# Patient Record
Sex: Male | Born: 1980 | Race: White | Hispanic: No | Marital: Married | Smoking: Never smoker
Health system: Southern US, Community
[De-identification: ages and names within clinical notes are randomized; demographics above are authoritative.]

---
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
## 2002-08-09 ENCOUNTER — Emergency Department (HOSPITAL_COMMUNITY): Admission: AC | Admit: 2002-08-09 | Discharge: 2002-08-09 | Payer: Self-pay

## 2007-05-09 ENCOUNTER — Encounter: Admission: RE | Admit: 2007-05-09 | Discharge: 2007-05-09 | Payer: Self-pay | Admitting: Internal Medicine

## 2012-07-22 ENCOUNTER — Other Ambulatory Visit: Payer: Self-pay

## 2012-07-22 ENCOUNTER — Emergency Department (HOSPITAL_COMMUNITY)
Admission: EM | Admit: 2012-07-22 | Discharge: 2012-07-22 | Disposition: A | Discharge status: Home / Self Care | Payer: BC Managed Care – PPO | Attending: Emergency Medicine | Admitting: Emergency Medicine

## 2012-07-22 ENCOUNTER — Emergency Department (HOSPITAL_COMMUNITY): Payer: BC Managed Care – PPO

## 2012-07-22 ENCOUNTER — Encounter (HOSPITAL_COMMUNITY): Payer: Self-pay | Admitting: *Deleted

## 2012-07-22 DIAGNOSIS — R079 Chest pain, unspecified: Secondary | ICD-10-CM

## 2012-07-22 DIAGNOSIS — R0789 Other chest pain: Secondary | ICD-10-CM | POA: Insufficient documentation

## 2012-07-22 LAB — BASIC METABOLIC PANEL
Calcium: 9.4 mg/dL (ref 8.4–10.5)
GFR calc Af Amer: >90 mL/min (ref >90)
GFR calc non Af Amer: >90 mL/min (ref >90)
Potassium: 3.7 mEq/L (ref 3.5–5.1)
Sodium: 139 mEq/L (ref 135–145)

## 2012-07-22 LAB — CBC
MCH: 30.9 pg (ref 26.0–34.0)
MCHC: 36.4 g/dL — ABNORMAL HIGH (ref 30.0–36.0)
Platelets: 246 10*3/uL (ref 150–400)
RDW: 12.8 % (ref 11.5–15.5)

## 2012-07-22 LAB — PRO B NATRIURETIC PEPTIDE: Pro B Natriuretic peptide (BNP): 29.6 pg/mL (ref 0–125)

## 2012-07-22 LAB — POCT I-STAT TROPONIN I: Troponin i, poc: 0 ng/mL (ref 0.00–0.08)

## 2012-07-22 NOTE — ED Notes (Signed)
Pt reports running short distance, began having discomfort in his neck, then onset of sharp pains in mid chest, sharp pains radiating into his shoulder. Reports mild sob. No acute distress noted at this time, ekg being done at triage.

## 2012-07-22 NOTE — ED Provider Notes (Signed)
History     CSN: [id number]  Arrival date & time 07/22/12  1520   First MD Initiated Contact with Patient 07/22/12 1604      Chief Complaint  Patient presents with  . Chest Pain    (Consider location/radiation/quality/duration/timing/severity/associated sxs/prior treatment) HPI..... Fleeting central anterior chest pain  with radiation to the left chest approximately one hour prior to admission. Symptoms lasted less than 60 seconds. No dyspnea, diaphoresis, nausea. Nonsmoker. No family history of cardiac disease. Patient has no risk factors for heart disease.   He is completely symptom free at this time   History reviewed. No pertinent past medical history.  History reviewed. No pertinent past surgical history.  History reviewed. No pertinent family history.  History  Substance Use Topics  . Smoking status: Not on file  . Smokeless tobacco: Current User    Types: Chew  . Alcohol Use: No      Review of Systems  All other systems reviewed and are negative.    Allergies  Review of patient's allergies indicates no known allergies.  Home Medications   Current Outpatient Rx  Name  Route  Sig  Dispense  Refill  . acetaminophen (TYLENOL) 80 MG chewable tablet   Oral   Chew 80 mg by mouth every 4 (four) hours as needed for pain.         [facility] Kitchen PRESCRIPTION MEDICATION   Oral   Take 1 tablet by mouth once. Nitroglycerin           BP 106/72  Pulse 77  Resp 18  SpO2 97%  Physical Exam  Nursing note and vitals reviewed. Constitutional: He is oriented to person, place, and time. He appears well-developed and well-nourished.  HENT:  Head: Normocephalic and atraumatic.  Eyes: Conjunctivae and EOM are normal. Pupils are equal, round, and reactive to light.  Neck: Normal range of motion. Neck supple.  Cardiovascular: Normal rate, regular rhythm and normal heart sounds.   Pulmonary/Chest: Effort normal and breath sounds normal.  Abdominal: Soft. Bowel sounds are normal.   Musculoskeletal: Normal range of motion.  Neurological: He is alert and oriented to person, place, and time.  Skin: Skin is warm and dry.  Psychiatric: He has a normal mood and affect.    ED Course  Procedures (including critical care time)  Labs Reviewed  CBC - Abnormal; Notable for the following:    MCHC 36.4 (*)    All other components within normal limits  BASIC METABOLIC PANEL - Abnormal; Notable for the following:    Glucose, Bld 136 (*)    All other components within normal limits  PRO B NATRIURETIC PEPTIDE  POCT I-STAT TROPONIN I   Dg Chest 2 View  07/22/2012   *RADIOLOGY REPORT*  Clinical Data: Chest pain.  CHEST - 2 VIEW  Comparison: None.  Findings: Midline trachea.  Normal heart size and mediastinal contours. No pleural effusion or pneumothorax.  Clear lungs.  IMPRESSION: Normal chest.   Original Report Authenticated By: Jeronimo Greaves, M.D.    Date: 07/22/2012  Rate: 85  Rhythm: normal sinus rhythm  QRS Axis: right  Intervals: normal  ST/T Wave abnormalities: normal  Conduction Disutrbances: none  Narrative Interpretation: unremarkable     1. Chest pain       MDM  Patient is very low risk for acute coronary syndrome or pulmonary embolism. Screening tests are normal. Patient will get primary care followup        Donnetta Hutching, MD 07/22/12 1800

## 2012-07-22 NOTE — ED Notes (Signed)
PT took 2 ,81mg  ASA today and onr NTG SL tab. For CP at home

## 2012-07-22 NOTE — ED Notes (Signed)
PT to xray

## 2012-09-11 ENCOUNTER — Encounter (HOSPITAL_COMMUNITY): Payer: Self-pay | Admitting: *Deleted

## 2012-09-11 ENCOUNTER — Emergency Department (HOSPITAL_COMMUNITY)
Admission: EM | Admit: 2012-09-11 | Discharge: 2012-09-12 | Disposition: A | Discharge status: Home / Self Care | Payer: BC Managed Care – PPO | Attending: Emergency Medicine | Admitting: Emergency Medicine

## 2012-09-11 DIAGNOSIS — W268XXA Contact with other sharp object(s), not elsewhere classified, initial encounter: Secondary | ICD-10-CM | POA: Insufficient documentation

## 2012-09-11 DIAGNOSIS — S61209A Unspecified open wound of unspecified finger without damage to nail, initial encounter: Secondary | ICD-10-CM | POA: Insufficient documentation

## 2012-09-11 DIAGNOSIS — Y93G9 Activity, other involving cooking and grilling: Secondary | ICD-10-CM | POA: Insufficient documentation

## 2012-09-11 DIAGNOSIS — Y929 Unspecified place or not applicable: Secondary | ICD-10-CM | POA: Insufficient documentation

## 2012-09-11 DIAGNOSIS — S61001A Unspecified open wound of right thumb without damage to nail, initial encounter: Secondary | ICD-10-CM

## 2012-09-11 MED ORDER — OXYCODONE-ACETAMINOPHEN 5-325 MG PO TABS
1.0000 | ORAL_TABLET | Freq: Once | ORAL | Status: AC
  Administered 2012-09-11: 1 via ORAL
  Filled 2012-09-11: qty 1

## 2012-09-11 MED ORDER — HYDROCODONE-ACETAMINOPHEN 5-325 MG PO TABS: 1.0000 | ORAL_TABLET | Freq: Four times a day (QID) | ORAL | Status: DC | PRN

## 2012-09-11 NOTE — ED Notes (Signed)
Pt reports he cut his right thumb using a slicer. Bleeding controlled at present. Pt came by EMS. Report he passed out after seeing the blood from his thumb.

## 2012-09-11 NOTE — ED Provider Notes (Signed)
History  This chart was scribed for non-physician practitioner working with Geoffery Lyons, MD by Greggory Stallion, ED scribe. This patient was seen in room WTR9/WTR9 and the patient's care was started at 11:35 PM.  CSN: [id number] Arrival date & time 09/11/12  2142  None    Chief Complaint  Patient presents with  . Laceration   The history is provided by the patient. No language interpreter was used.    HPI Comments: Aaron Freeman is a 32 y.o. male brought to ED by EMS who presents to the Emergency Department complaining sudden onset pain to right thumb. Pt states he sliced his hand with a kitchen mandolin around 6:30 PM tonight. Pain is sharp, localized throbbing when touched. Moderate when left alone. He states he can move it normally. Pt denies numbness. Pt states is UTD with tetanus.   History reviewed. No pertinent past medical history. History reviewed. No pertinent past surgical history. History reviewed. No pertinent family history. History  Substance Use Topics  . Smoking status: Not on file  . Smokeless tobacco: Current User    Types: Chew  . Alcohol Use: No    Review of Systems  Constitutional: Negative for fever and diaphoresis.  HENT: Negative for neck pain and neck stiffness.   Eyes: Negative for visual disturbance.  Respiratory: Negative for apnea, chest tightness and shortness of breath.   Cardiovascular: Negative for chest pain and palpitations.  Gastrointestinal: Negative for nausea, vomiting, diarrhea and constipation.  Genitourinary: Negative for dysuria.  Musculoskeletal: Negative for gait problem.  Skin: Positive for wound. Negative for rash.  Neurological: Negative for dizziness, weakness, light-headedness, numbness and headaches.    Allergies  Review of patient's allergies indicates no known allergies.  Home Medications   Current Outpatient Rx  Name  Route  Sig  Dispense  Refill  . ibuprofen (ADVIL,MOTRIN) 200 MG tablet   Oral   Take 400 mg by  mouth every 6 (six) hours as needed for pain (pain).          BP 129/85  Pulse 79  Temp(Src) 98.7 F (37.1 C) (Oral)  Resp 18  SpO2 98%  Physical Exam  Nursing note and vitals reviewed. Constitutional: He is oriented to person, place, and time. He appears well-developed and well-nourished. No distress.  HENT:  Head: Normocephalic and atraumatic.  Eyes: Conjunctivae and EOM are normal.  Neck: Normal range of motion. Neck supple.  No meningeal signs  Cardiovascular: Normal rate, regular rhythm and normal heart sounds.  Exam reveals no gallop and no friction rub.   No murmur heard. Pulmonary/Chest: Effort normal and breath sounds normal. No respiratory distress. He has no wheezes. He has no rales. He exhibits no tenderness.  Abdominal: Soft. Bowel sounds are normal. He exhibits no distension. There is no tenderness. There is no rebound and no guarding.  Musculoskeletal: Normal range of motion. He exhibits no edema and no tenderness.  Neurological: He is alert and oriented to person, place, and time. No cranial nerve deficit.  Neurovascularly intact. Sensation to light touch and two point discrimination intact.   Skin: Skin is warm and dry. He is not diaphoretic. No erythema.  3 cm long, 2 cm wide laceration to lateral distal aspect of left thumb. Deep to dermis. No tendon involvement. No nail involvement. No visible bone.     ED Course  Procedures (including critical care time)  DIAGNOSTIC STUDIES: Oxygen Saturation is 98% on RA, normal by my interpretation.    COORDINATION OF CARE: 11:39  PM-Discussed treatment plan which includes pain medication and cleaning laceration and dressing it with pt at bedside and pt agreed to plan. Advised pt to follow up with a PCP for wound check.  Labs Reviewed - No data to display No results found. 1. Avulsion of skin of thumb without complication, right, initial encounter     MDM  Wound cleaning complete with pressure irrigation, bottom of  wound visualized, no foreign bodies appreciated. Laceration occurred < 8 hours prior. Skin avulsion to lateral aspect of right thumb deep to dermis, no bone visualized, no nail or tendon involvement, not amendable to laceration repair. Will treat with bacitracin, xeroform, and dry sterile dressing. Discussed wound care with pt, reasons to seek medical care during the healing process, and pain control. Discussed reasons to seek immediate care. Patient expresses understanding and agrees with plan.  I personally performed the services described in this documentation, which was scribed in my presence. The recorded information has been reviewed and is accurate.    Glade Nurse, PA-C 09/12/12 [phone]

## 2012-09-12 NOTE — ED Provider Notes (Signed)
Medical screening examination/treatment/procedure(s) were performed by non-physician practitioner and as supervising physician I was immediately available for consultation/collaboration.  Geoffery Lyons, MD 09/12/12 [phone]

## 2012-09-12 NOTE — ED Notes (Signed)
Pt reports cooking dinner tonight using a mandoline slicer-was not using the safety.  Avulsion noted on pt's R thumb.

## 2013-03-25 ENCOUNTER — Ambulatory Visit (INDEPENDENT_AMBULATORY_CARE_PROVIDER_SITE_OTHER): Payer: BC Managed Care – PPO | Admitting: Emergency Medicine

## 2013-03-25 VITALS — BP 110/80 | HR 77 | Temp 98.0°F | Resp 16 | Ht 73.0 in | Wt 280.0 lb

## 2013-03-25 DIAGNOSIS — M109 Gout, unspecified: Secondary | ICD-10-CM

## 2013-03-25 MED ORDER — COLCHICINE 0.6 MG PO TABS: ORAL_TABLET | ORAL | Status: DC

## 2013-03-25 MED ORDER — INDOMETHACIN 50 MG PO CAPS: 50.0000 mg | ORAL_CAPSULE | Freq: Three times a day (TID) | ORAL | Status: DC

## 2013-03-25 NOTE — Progress Notes (Signed)
[facility] [address], [place] [address] [phone]  Date:  03/25/2013   Name:  Aaron AdeRichard D Freeman   DOB:  1980/09/08   MRN:  [id number]  PCP:  No PCP Per Patient    Chief Complaint: Gout   History of Present Illness:  Aaron Freeman is a 33 y.o. very pleasant male patient who presents with the following:  History of gout.  Says he has an exacerbation every 3-4 months.  Now has pain in right knee that has been present 2 days.  4-5th flare.   Out of medication.  Last seen here in 2012.  No history of injury or overuse.  No fever or chills.  No improvement with over the counter medications or other home remedies. Denies other complaint or health concern today.  There are no active problems to display for this patient.   History reviewed. No pertinent past medical history.  History reviewed. No pertinent past surgical history.  History  Substance Use Topics  . Smoking status: Never Smoker   . Smokeless tobacco: Current User    Types: Chew  . Alcohol Use: No    History reviewed. No pertinent family history.  No Known Allergies  Medication list has been reviewed and updated.  Current Outpatient Prescriptions on File Prior to Visit  Medication Sig Dispense Refill  . HYDROcodone-acetaminophen (NORCO/VICODIN) 5-325 MG per tablet Take 1 tablet by mouth every 6 (six) hours as needed for pain.  10 tablet  0  . ibuprofen (ADVIL,MOTRIN) 200 MG tablet Take 400 mg by mouth every 6 (six) hours as needed for pain (pain).       No current facility-administered medications on file prior to visit.    Review of Systems:  As per HPI, otherwise negative.    Physical Examination: Filed Vitals:   03/25/13 1452  BP: 110/80  Pulse: 77  Temp: 98 F (36.7 C)  Resp: 16   Filed Vitals:   03/25/13 1452  Height: 6\' 1"  (1.854 m)  Weight: 280 lb (127.007 kg)   Body mass index is 36.95 kg/(m^2). Ideal Body Weight: Weight in (lb) to have BMI = 25:  189.1   GEN: WDWN, NAD, Non-toxic, Alert & Oriented x 3 HEENT: Atraumatic, Normocephalic.  Ears and Nose: No external deformity. EXTR: No clubbing/cyanosis/edema NEURO: Normal gait.  PSYCH: Normally interactive. Conversant. Not depressed or anxious appearing.  Calm demeanor.  RIGHT KNEE:  Swollen moderate effusion.  Warm.  No cellulitis   Assessment and Plan: Gout colcrys Indocin  Signed,  Phillips OdorJeffery Anderson, MD

## 2013-03-25 NOTE — Patient Instructions (Signed)

## 2014-01-09 ENCOUNTER — Ambulatory Visit (INDEPENDENT_AMBULATORY_CARE_PROVIDER_SITE_OTHER): Payer: BC Managed Care – PPO | Admitting: Podiatry

## 2014-01-09 ENCOUNTER — Ambulatory Visit (INDEPENDENT_AMBULATORY_CARE_PROVIDER_SITE_OTHER): Payer: BC Managed Care – PPO

## 2014-01-09 ENCOUNTER — Encounter: Payer: Self-pay | Admitting: Podiatry

## 2014-01-09 VITALS — BP 137/87 | HR 88 | Resp 16

## 2014-01-09 DIAGNOSIS — M79672 Pain in left foot: Secondary | ICD-10-CM

## 2014-01-09 DIAGNOSIS — M2021 Hallux rigidus, right foot: Secondary | ICD-10-CM

## 2014-01-09 DIAGNOSIS — M779 Enthesopathy, unspecified: Secondary | ICD-10-CM

## 2014-01-09 DIAGNOSIS — M722 Plantar fascial fibromatosis: Secondary | ICD-10-CM

## 2014-01-09 MED ORDER — TRIAMCINOLONE ACETONIDE 10 MG/ML IJ SUSP
10.0000 mg | Freq: Once | INTRAMUSCULAR | Status: AC
  Administered 2014-01-09: 10 mg

## 2014-01-09 MED ORDER — ALLOPURINOL 100 MG PO TABS: 100.0000 mg | ORAL_TABLET | Freq: Every day | ORAL | Status: DC

## 2014-01-09 MED ORDER — PREDNISONE 10 MG PO TABS: ORAL_TABLET | ORAL | Status: DC

## 2014-01-09 NOTE — Progress Notes (Signed)
   Subjective:    Patient ID: Aaron Freeman, male    DOB: 09/02/1980, 33 y.o.   MRN: [id number]  HPI Comments: "I have this pain in my foot"  Patient c/o sharp arch and dorsal left foot for about 3 weeks. Some swelling. Some AM pain. Worse as the day goes on. Tried icing and soaking.  Foot Pain Associated symptoms include arthralgias and myalgias.      Review of Systems  Musculoskeletal: Positive for myalgias, arthralgias and gait problem.  All other systems reviewed and are negative.      Objective:   Physical Exam        Assessment & Plan:

## 2014-01-09 NOTE — Progress Notes (Signed)
Subjective:     Patient ID: Aaron Freeman, male   DOB: 04-01-1980, 33 y.o.   MRN: [id number]  HPIpatient presents stating I been getting a lot of pain in my left arch I've had a long-term history of gout and I get 3-4 attacks either my big toe joint left or my knee and I take indomethacin and colchicine but nothing to help prevent the attacks and my arches making it hard for me to walk. [place]   Review of Systems  All other systems reviewed and are negative.      Objective:   Physical Exam  Constitutional: He is oriented to person, place, and time.  Cardiovascular: Intact distal pulses.   Musculoskeletal: Normal range of motion.  Neurological: He is oriented to person, place, and time.  Skin: Skin is warm.  Nursing note and vitals reviewed. neurovascular status found to be intact with muscle strength adequate and range of motion of the subtalar and midtarsal joint within normal limits. Patient is noted to have significant discomfort in the first metatarsophalangeal joint left and also discomfort of an intense nature left plantar fascia in the mid arch area. 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 Injected the left plantar fascia 3 mg Kenalog 5 g Xylocaine and applied fascial strapping and also today went ahead and I placed on a sterile prep DS 12 day Dosepak and also on allopurinol 100 mg daily as I do not believe he has had good control of his gout over the years

## 2014-01-16 ENCOUNTER — Ambulatory Visit (INDEPENDENT_AMBULATORY_CARE_PROVIDER_SITE_OTHER): Payer: BC Managed Care – PPO | Admitting: Podiatry

## 2014-01-16 ENCOUNTER — Encounter: Payer: Self-pay | Admitting: Podiatry

## 2014-01-16 VITALS — BP 122/73 | HR 71 | Resp 12

## 2014-01-16 DIAGNOSIS — M722 Plantar fascial fibromatosis: Secondary | ICD-10-CM

## 2014-01-16 NOTE — Progress Notes (Signed)
Subjective:     Patient ID: Aaron Freeman, male   DOB: [birth year], 33 y.o.   MRN: [id number]  HPIpatient presents stating my left arch is feeling quite a bit better and my toe joint is also improved with the medication you placed me on   Review of Systems     Objective:   Physical Exam Neurovascular status intact with continued discomfort in the left arch but diminished from previous and significant increased motion big toe joint left after taking steroid Dosepak and starting allopurinol    Assessment:     Plantar fasciitis still present left but improved and inflammatory changes first metatarsal head joint that have improved with medication    Plan:     Due to long-standing nature and structural abnormalities I did go ahead today and I scanned for custom orthotics to reduce stress against his feet and instructed on continuing medication

## 2015-02-27 ENCOUNTER — Other Ambulatory Visit: Payer: Self-pay | Admitting: Podiatry

## 2015-10-09 ENCOUNTER — Emergency Department (HOSPITAL_COMMUNITY): Payer: No Typology Code available for payment source

## 2015-10-09 ENCOUNTER — Emergency Department (HOSPITAL_COMMUNITY)
Admission: EM | Admit: 2015-10-09 | Discharge: 2015-10-09 | Disposition: A | Discharge status: Home / Self Care | Payer: No Typology Code available for payment source | Attending: Emergency Medicine | Admitting: Emergency Medicine

## 2015-10-09 ENCOUNTER — Encounter (HOSPITAL_COMMUNITY): Payer: Self-pay | Admitting: *Deleted

## 2015-10-09 DIAGNOSIS — R55 Syncope and collapse: Secondary | ICD-10-CM | POA: Diagnosis not present

## 2015-10-09 DIAGNOSIS — S59912A Unspecified injury of left forearm, initial encounter: Secondary | ICD-10-CM | POA: Diagnosis present

## 2015-10-09 DIAGNOSIS — S5012XA Contusion of left forearm, initial encounter: Secondary | ICD-10-CM | POA: Diagnosis not present

## 2015-10-09 DIAGNOSIS — S8012XA Contusion of left lower leg, initial encounter: Secondary | ICD-10-CM | POA: Diagnosis not present

## 2015-10-09 DIAGNOSIS — Y939 Activity, unspecified: Secondary | ICD-10-CM | POA: Diagnosis not present

## 2015-10-09 DIAGNOSIS — Z23 Encounter for immunization: Secondary | ICD-10-CM | POA: Insufficient documentation

## 2015-10-09 DIAGNOSIS — R109 Unspecified abdominal pain: Secondary | ICD-10-CM | POA: Diagnosis not present

## 2015-10-09 DIAGNOSIS — R079 Chest pain, unspecified: Secondary | ICD-10-CM | POA: Insufficient documentation

## 2015-10-09 DIAGNOSIS — S40022A Contusion of left upper arm, initial encounter: Secondary | ICD-10-CM

## 2015-10-09 DIAGNOSIS — Y999 Unspecified external cause status: Secondary | ICD-10-CM | POA: Diagnosis not present

## 2015-10-09 DIAGNOSIS — Y92410 Unspecified street and highway as the place of occurrence of the external cause: Secondary | ICD-10-CM | POA: Diagnosis not present

## 2015-10-09 LAB — I-STAT CHEM 8, ED
BUN: 19 mg/dL (ref 6–20)
CALCIUM ION: 1.16 mmol/L (ref 1.13–1.30)
CHLORIDE: 102 mmol/L (ref 101–111)
CREATININE: 1.3 mg/dL — AB (ref 0.61–1.24)
Glucose, Bld: 101 mg/dL — ABNORMAL HIGH (ref 65–99)
HCT: 43 % (ref 39.0–52.0)
Hemoglobin: 14.6 g/dL (ref 13.0–17.0)
Potassium: 4.6 mmol/L (ref 3.5–5.1)
SODIUM: 143 mmol/L (ref 135–145)
TCO2: 29 mmol/L (ref 0–100)

## 2015-10-09 LAB — URINALYSIS, ROUTINE W REFLEX MICROSCOPIC
BILIRUBIN URINE: NEGATIVE
Glucose, UA: NEGATIVE mg/dL
Hgb urine dipstick: NEGATIVE
KETONES UR: NEGATIVE mg/dL
LEUKOCYTES UA: NEGATIVE
NITRITE: NEGATIVE
PROTEIN: NEGATIVE mg/dL
Specific Gravity, Urine: 1.019 (ref 1.005–1.030)
pH: 7 (ref 5.0–8.0)

## 2015-10-09 LAB — CBC
HCT: 44.3 % (ref 39.0–52.0)
Hemoglobin: 15.3 g/dL (ref 13.0–17.0)
MCH: 31.4 pg (ref 26.0–34.0)
MCHC: 34.5 g/dL (ref 30.0–36.0)
MCV: 90.8 fL (ref 78.0–100.0)
Platelets: 224 10*3/uL (ref 150–400)
RBC: 4.88 MIL/uL (ref 4.22–5.81)
RDW: 12.9 % (ref 11.5–15.5)
WBC: 9.1 10*3/uL (ref 4.0–10.5)

## 2015-10-09 LAB — COMPREHENSIVE METABOLIC PANEL
ALBUMIN: 4.8 g/dL (ref 3.5–5.0)
ALK PHOS: 46 U/L (ref 38–126)
ALT: 66 U/L — ABNORMAL HIGH (ref 17–63)
ANION GAP: 8 (ref 5–15)
AST: 34 U/L (ref 15–41)
BILIRUBIN TOTAL: 1.2 mg/dL (ref 0.3–1.2)
BUN: 16 mg/dL (ref 6–20)
CALCIUM: 9.9 mg/dL (ref 8.9–10.3)
CO2: 30 mmol/L (ref 22–32)
Chloride: 103 mmol/L (ref 101–111)
Creatinine, Ser: 1.19 mg/dL (ref 0.61–1.24)
GFR calc Af Amer: >60 mL/min (ref >60)
GLUCOSE: 103 mg/dL — AB (ref 65–99)
POTASSIUM: 4.3 mmol/L (ref 3.5–5.1)
Sodium: 141 mmol/L (ref 135–145)
TOTAL PROTEIN: 7.4 g/dL (ref 6.5–8.1)

## 2015-10-09 LAB — SAMPLE TO BLOOD BANK

## 2015-10-09 LAB — PROTIME-INR
INR: 1.03
PROTHROMBIN TIME: 13.5 s (ref 11.4–15.2)

## 2015-10-09 LAB — I-STAT CG4 LACTIC ACID, ED: LACTIC ACID, VENOUS: 1.07 mmol/L (ref 0.5–1.9)

## 2015-10-09 MED ORDER — TETANUS-DIPHTH-ACELL PERTUSSIS 5-2.5-18.5 LF-MCG/0.5 IM SUSP
INTRAMUSCULAR | Status: AC
  Filled 2015-10-09: qty 0.5

## 2015-10-09 MED ORDER — ACETAMINOPHEN 500 MG PO TABS
1000.0000 mg | ORAL_TABLET | Freq: Once | ORAL | Status: AC
  Administered 2015-10-09: 1000 mg via ORAL
  Filled 2015-10-09: qty 2

## 2015-10-09 MED ORDER — TETANUS-DIPHTH-ACELL PERTUSSIS 5-2.5-18.5 LF-MCG/0.5 IM SUSP
0.5000 mL | Freq: Once | INTRAMUSCULAR | Status: AC
  Administered 2015-10-09: 0.5 mL via INTRAMUSCULAR

## 2015-10-09 MED ORDER — SODIUM CHLORIDE 0.9 % IV BOLUS (SEPSIS)
1000.0000 mL | Freq: Once | INTRAVENOUS | Status: AC
Start: 2015-10-09 — End: 2015-10-09
  Administered 2015-10-09: 1000 mL via INTRAVENOUS

## 2015-10-09 MED ORDER — FENTANYL CITRATE (PF) 100 MCG/2ML IJ SOLN
INTRAMUSCULAR | Status: AC
  Filled 2015-10-09: qty 2

## 2015-10-09 MED ORDER — FENTANYL CITRATE (PF) 100 MCG/2ML IJ SOLN
50.0000 ug | Freq: Once | INTRAMUSCULAR | Status: AC
  Administered 2015-10-09: 50 ug via INTRAVENOUS

## 2015-10-09 MED ORDER — IOPAMIDOL (ISOVUE-300) INJECTION 61%
INTRAVENOUS | Status: AC
  Administered 2015-10-09: 100 mL
  Filled 2015-10-09: qty 100
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
## 2015-10-09 NOTE — Discharge Instructions (Signed)
For pain you can take 650mg  of Tylenol every 6 hours. For more pain control, you can add Ibuprofen 400mg , 3 hours after takin Tylenol. Do not take for more than 5 days.
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
## 2015-10-09 NOTE — ED Notes (Signed)
Patient transported to CT and xray 

## 2015-10-09 NOTE — ED Notes (Signed)
Fast assessment negative

## 2015-10-10 LAB — CDS SEROLOGY
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
## 2016-04-19 ENCOUNTER — Ambulatory Visit (INDEPENDENT_AMBULATORY_CARE_PROVIDER_SITE_OTHER): Payer: BLUE CROSS/BLUE SHIELD | Admitting: Family Medicine

## 2016-04-19 VITALS — BP 136/98 | HR 68 | Temp 98.4°F | Resp 16 | Ht 74.5 in | Wt 280.6 lb

## 2016-04-19 DIAGNOSIS — M109 Gout, unspecified: Secondary | ICD-10-CM | POA: Diagnosis not present

## 2016-04-19 MED ORDER — PREDNISONE 20 MG PO TABS: ORAL_TABLET | ORAL | 0 refills | Status: DC

## 2016-04-19 MED ORDER — COLCHICINE 0.6 MG PO TABS: 0.6000 mg | ORAL_TABLET | Freq: Every day | ORAL | 4 refills | Status: DC

## 2016-04-19 NOTE — Progress Notes (Signed)
Patient ID: Aaron Freeman, male    DOB: November 24, 1980, 36 y.o.   MRN: [id number]  PCP: No PCP Per Patient  Chief Complaint  Patient presents with  . Gout    Subjective:  HPI  36 year old male presents for evaluation of swelling with redness of the left great toe. Patient reports a chronic hx of gout flares. Reports his first episode of gout occurred at age 4 year old. Reports current flare up occurred x 1 days. Initial pain which later progressed to significant swelling, erythema with tenderness to touch. He was previously on preventative therapy with allopurinol, although he reports it has been  sometime since he had taken allopurinol.  Social History   Social History  . Marital status: Married    Spouse name: N/A  . Number of children: N/A  . Years of education: N/A   Occupational History  . Not on file.   Social History Main Topics  . Smoking status: Never Smoker  . Smokeless tobacco: Current User    Types: Chew  . Alcohol use No  . Drug use: No  . Sexual activity: Yes   Other Topics Concern  . Not on file   Social History Narrative  . No narrative on file    No family history on file.   Review of Systems  See HPI  No Known Allergies  Prior to Admission medications   Medication Sig Start Date End Date Taking? Authorizing Provider  acetaminophen (TYLENOL) 650 MG CR tablet Take 650 mg by mouth every morning.   Yes Historical Provider, MD    Past Medical, Surgical Family and Social History reviewed and updated.    Objective:   Today's Vitals   04/19/16 1338  BP: (!) 136/98  Pulse: 68  Resp: 16  Temp: 98.4 F (36.9 C)  TempSrc: Oral  SpO2: 97%  Weight: 280 lb 9.6 oz (127.3 kg)  Height: 6' 2.5" (1.892 m)    Wt Readings from Last 3 Encounters:  04/19/16 280 lb 9.6 oz (127.3 kg)  10/09/15 258 lb (117 kg)  03/25/13 280 lb (127 kg)    Physical Exam  Constitutional: He is oriented to person, place, and time. He appears well-developed and  well-nourished.  HENT:  Head: Normocephalic and atraumatic.  Eyes: Pupils are equal, round, and reactive to light.  Neck: Normal range of motion. Neck supple.  Cardiovascular: Normal rate, regular rhythm, normal heart sounds and intact distal pulses.   Pulmonary/Chest: Effort normal and breath sounds normal.  Musculoskeletal: He exhibits edema and tenderness.  Lymphadenopathy:    He has no cervical adenopathy.  Neurological: He is alert and oriented to person, place, and time.  Skin: Skin is warm. There is erythema.  Left Great toe erythematous, edema, bony tenderness at the base of the toe to the tip of toe  Psychiatric: He has a normal mood and affect. His behavior is normal. Judgment and thought content normal.      Assessment & Plan:  1. Acute gout involving toe of left foot, unspecified cause Plan:  Take Prednisone 20 mg,  in mornings with breakfast as follows:  Take 3 pills for 3 days, Take 2 pills for 3 days, and Take 1 pill for 3 days.  Complete all medication.  Today take 2 Colchine tablets 0.6 mg and repeat take 1 pill 0.6 mg in 1 hours. Then start 0.6 mg daily for prevention.  Return for follow-up as needed.  Godfrey Pick. Tiburcio Pea, MSN, FNP-C Primary Care  at [facility] [phone]

## 2016-04-19 NOTE — Patient Instructions (Addendum)
Take Prednisone 20 mg,  in mornings with breakfast as follows:  Take 3 pills for 3 days, Take 2 pills for 3 days, and Take 1 pill for 3 days.  Complete all medication.  Today take 2 Colchine tablets 0.6 mg and repeat take 1 pill 0.6 mg in 1 hours. Then start 0.6 mg daily for prevention.   IF you received an x-ray today, you will receive an invoice from [facility]. Please contact [facility] at [phone] with questions or concerns regarding your invoice.   IF you received labwork today, you will receive an invoice from [facility]. Please contact [facility] at [phone] with questions or concerns regarding your invoice.   Our billing staff will not be able to assist you with questions regarding bills from these companies.  You will be contacted with the lab results as soon as they are available. The fastest way to get your results is to activate your My Chart account. Instructions are located on the last page of this paperwork. If you have not heard from [place] regarding the results in 2 weeks, please contact this office.     Gout Introduction Gout is painful swelling that can happen in some of your joints. Gout is a type of arthritis. This condition is caused by having too much uric acid in your body. Uric acid is a chemical that is made when your body breaks down substances called purines. If your body has too much uric acid, sharp crystals can form and build up in your joints. This causes pain and swelling. Gout attacks can happen quickly and be very painful (acute gout). Over time, the attacks can affect more joints and happen more often (chronic gout). Follow these instructions at home: During a Gout Attack  If directed, put ice on the painful area:  Put ice in a plastic bag.  Place a towel between your skin and the bag.  Leave the ice on for 20 minutes, 2-3 times a day.  Rest the joint as much as possible. If the joint is in your leg, you may be given crutches to  use.  Raise (elevate) the painful joint above the level of your heart as often as you can.  Drink enough fluids to keep your pee (urine) clear or pale yellow.  Take over-the-counter and prescription medicines only as told by your doctor.  Do not drive or use heavy machinery while taking prescription pain medicine.  Follow instructions from your doctor about what you can or cannot eat and drink.  Return to your normal activities as told by your doctor. Ask your doctor what activities are safe for you. Avoiding Future Gout Attacks  Follow a low-purine diet as told by a specialist (dietitian) or your doctor. Avoid foods and drinks that have a lot of purines, such as:  Liver.  Kidney.  Anchovies.  Asparagus.  Herring.  Mushrooms  Mussels.  Beer.  Limit alcohol intake to no more than 1 drink a day for nonpregnant women and 2 drinks a day for men. One drink equals 12 oz of beer, 5 oz of wine, or 1 oz of hard liquor.  Stay at a healthy weight or lose weight if you are overweight. If you want to lose weight, talk with your doctor. It is important that you do not lose weight too fast.  Start or continue an exercise plan as told by your doctor.  Drink enough fluids to keep your pee clear or pale yellow.  Take over-the-counter and prescription medicines  only as told by your doctor.  Keep all follow-up visits as told by your doctor. This is important. Contact a doctor if:  You have another gout attack.  You still have symptoms of a gout attack after10 days of treatment.  You have problems (side effects) because of your medicines.  You have chills or a fever.  You have burning pain when you pee (urinate).  You have pain in your lower back or belly. Get help right away if:  You have very bad pain.  Your pain cannot be controlled.  You cannot pee. This information is not intended to replace advice given to you by your health care provider. Make sure you discuss any  questions you have with your health care provider. Document Released: 12/01/2007 Document Revised: 07/30/2015 Document Reviewed: 12/04/2014  2017 Elsevier

## 2016-05-12 DIAGNOSIS — H6692 Otitis media, unspecified, left ear: Secondary | ICD-10-CM | POA: Diagnosis not present

## 2016-08-23 ENCOUNTER — Telehealth: Payer: Self-pay | Admitting: Family Medicine

## 2016-08-23 NOTE — Telephone Encounter (Signed)
PT CALLING FOR A REFILL ON COLCHICINE PLEASE CALL PT WHEN READY

## 2016-08-25 NOTE — Telephone Encounter (Signed)
Called the pharmacy, spoke with pharmacist, the patient has 5 refills, he never picked up his meds. Called the patient to let him know, but the VM box is full, and can't leave  any messages.

## 2016-08-30 NOTE — Telephone Encounter (Signed)
Attempted to call, the mail box is full, and can't accept any messages.
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
## 2017-11-18 ENCOUNTER — Ambulatory Visit: Payer: BLUE CROSS/BLUE SHIELD | Admitting: Family Medicine

## 2017-11-18 ENCOUNTER — Other Ambulatory Visit: Payer: Self-pay

## 2017-11-18 ENCOUNTER — Encounter: Payer: Self-pay | Admitting: Family Medicine

## 2017-11-18 VITALS — BP 136/80 | HR 69 | Temp 97.6°F | Ht 73.0 in | Wt 273.4 lb

## 2017-11-18 DIAGNOSIS — M109 Gout, unspecified: Secondary | ICD-10-CM | POA: Diagnosis not present

## 2017-11-18 MED ORDER — COLCHICINE 0.6 MG PO TABS: ORAL_TABLET | ORAL | 1 refills | Status: DC

## 2017-11-18 MED ORDER — ALLOPURINOL 100 MG PO TABS: 100.0000 mg | ORAL_TABLET | Freq: Every day | ORAL | 6 refills | Status: DC

## 2017-11-18 MED ORDER — PREDNISONE 20 MG PO TABS: ORAL_TABLET | ORAL | 0 refills | Status: DC

## 2017-11-18 NOTE — Progress Notes (Signed)
Patient ID: Aaron Freeman, male    DOB: Mar 15, 1980  Age: 37 y.o. MRN: [id number]  Chief Complaint  Patient presents with  . Gout Flair    5 m out of medication     Subjective:   37 year old man with about a 6 or 7-year history of gout.  He has flares.  It started out in his large toe, then knees, but today he has it in his left ankle.  Is been hurting for for 5 days.  He has been out of medicine for a long time.  He does not know what triggered it.  He is otherwise healthy.  Current allergies, medications, problem list, past/family and social histories reviewed.  Objective:  BP 136/80   Pulse 69   Temp 97.6 F (36.4 C) (Oral)   Ht 6\' 1"  (1.854 m)   Wt 273 lb 6.4 oz (124 kg)   SpO2 97%   BMI 36.07 kg/m   No acute distress.  No ankle edema.  No erythema.  Very tender below the Achilles tendon around the ankle joint.  A little swelling.  Great toe not tender.  Assessment & Plan:   Assessment: 1. Acute gout of left ankle, unspecified cause       Plan: See instructions.  Begin allopurinol if the uric acid is elevated will be calling back.  Orders Placed This Encounter  Procedures  . Comprehensive metabolic panel  . Uric acid    Meds ordered this encounter  Medications  . predniSONE (DELTASONE) 20 MG tablet    Sig: Take 3 daily for 2 days, then 2 daily for 2 days, then 1 daily for 2 days for gout    Dispense:  12 tablet    Refill:  0  . allopurinol (ZYLOPRIM) 100 MG tablet    Sig: Take 1 tablet (100 mg total) by mouth daily.    Dispense:  30 tablet    Refill:  6  . colchicine 0.6 MG tablet    Sig: Take 1 twice daily today, then 1 daily until gout is improved.  Use whenever an acute flare hips.    Dispense:  30 tablet    Refill:  1         Patient Instructions    Take prednisone 20 mg 3 daily for 2 days, then 2 daily for 2 days, then 1 daily for 2 days  Take the colchicine twice today, then once daily until acute gout has calmed.  Save the colchicine to  use as needed when you have an acute attack.  If your uric acid level is significantly elevated, then we will contact you and tell you to get the allopurinol filled and to begin taking it.  Advise returning for lab work in about 6 months, sooner if problems.   If you have lab work done today you will be contacted with your lab results within the next 2 weeks.  If you have not heard from us then please contact us. The fastest way to get your results is to register for My Chart.   IF you received an x-ray today, you will receive an invoice from [facility]. Please contact [facility] at [phone] with questions or concerns regarding your invoice.  Take prednisone 20 mg 3 daily for 2 days, then 2 daily for 2 days, then 1 daily for 2 days. IF you received labwork today, you will receive an invoice from [facility]. Please contact [facility] at [phone] with questions or concerns regarding your invoice.  Our billing staff will not be able to assist you with questions regarding bills from these companies.  You will be contacted with the lab results as soon as they are available. The fastest way to get your results is to activate your My Chart account. Instructions are located on the last page of this paperwork. If you have not heard from [place] regarding the results in 2 weeks, please contact this office.         Return in about 6 months (around 05/19/2018), or if symptoms worsen or fail to improve.   Janace Hoard, MD 11/18/2017

## 2017-11-18 NOTE — Patient Instructions (Addendum)
  Take prednisone 20 mg 3 daily for 2 days, then 2 daily for 2 days, then 1 daily for 2 days  Take the colchicine twice today, then once daily until acute gout has calmed.  Save the colchicine to use as needed when you have an acute attack.  If your uric acid level is significantly elevated, then we will contact you and tell you to get the allopurinol filled and to begin taking it.  Advise returning for lab work in about 6 months, sooner if problems.   If you have lab work done today you will be contacted with your lab results within the next 2 weeks.  If you have not heard from us then please contact us. The fastest way to get your results is to register for My Chart.   IF you received an x-ray today, you will receive an invoice from [facility]. Please contact [facility] at [phone] with questions or concerns regarding your invoice.  Take prednisone 20 mg 3 daily for 2 days, then 2 daily for 2 days, then 1 daily for 2 days. IF you received labwork today, you will receive an invoice from [facility]. Please contact [facility] at [phone] with questions or concerns regarding your invoice.   Our billing staff will not be able to assist you with questions regarding bills from these companies.  You will be contacted with the lab results as soon as they are available. The fastest way to get your results is to activate your My Chart account. Instructions are located on the last page of this paperwork. If you have not heard from us regarding the results in 2 weeks, please contact this office.

## 2017-11-19 LAB — COMPREHENSIVE METABOLIC PANEL
ALBUMIN: 4.8 g/dL (ref 3.5–5.5)
ALK PHOS: 57 IU/L (ref 39–117)
ALT: 60 IU/L — AB (ref 0–44)
AST: 31 IU/L (ref 0–40)
Albumin/Globulin Ratio: 1.8 (ref 1.2–2.2)
BUN / CREAT RATIO: 11 (ref 9–20)
BUN: 11 mg/dL (ref 6–20)
Bilirubin Total: 0.4 mg/dL (ref 0.0–1.2)
CHLORIDE: 102 mmol/L (ref 96–106)
CO2: 22 mmol/L (ref 20–29)
CREATININE: 1.03 mg/dL (ref 0.76–1.27)
Calcium: 9.9 mg/dL (ref 8.7–10.2)
GFR calc Af Amer: 107 mL/min/{1.73_m2} (ref >59)
GFR calc non Af Amer: 92 mL/min/{1.73_m2} (ref >59)
GLUCOSE: 103 mg/dL — AB (ref 65–99)
Globulin, Total: 2.6 g/dL (ref 1.5–4.5)
Potassium: 4.2 mmol/L (ref 3.5–5.2)
Sodium: 145 mmol/L — ABNORMAL HIGH (ref 134–144)
Total Protein: 7.4 g/dL (ref 6.0–8.5)

## 2017-11-19 LAB — URIC ACID: URIC ACID: 12.1 mg/dL — AB (ref 3.7–8.6)

## 2017-12-15 ENCOUNTER — Other Ambulatory Visit: Payer: Self-pay

## 2017-12-15 ENCOUNTER — Other Ambulatory Visit: Payer: Self-pay | Admitting: Family Medicine

## 2017-12-15 DIAGNOSIS — M109 Gout, unspecified: Secondary | ICD-10-CM

## 2017-12-15 MED ORDER — ALLOPURINOL 100 MG PO TABS: 100.0000 mg | ORAL_TABLET | Freq: Every day | ORAL | 6 refills | Status: DC

## 2017-12-15 MED ORDER — ALLOPURINOL 100 MG PO TABS: 100.0000 mg | ORAL_TABLET | Freq: Every day | ORAL | 5 refills | Status: DC

## 2017-12-15 NOTE — Telephone Encounter (Signed)
Copied from CRM [id number]. Topic: Quick Communication - Rx Refill/Question >> Dec 15, 2017  8:25 AM Gerrianne Scale wrote: Medication: allopurinol (ZYLOPRIM) 100 MG tablet  pt lost RX   Has the patient contacted their pharmacy? No.  Pt lost paper RX (Agent: If no, request that the patient contact the pharmacy for the refill.) (Agent: If yes, when and what did the pharmacy advise?)  Preferred Pharmacy (with phone number or street name): [id number] - [place], [place] - [address] [place] [address] [phone] (Phone) [phone] (Fax)    Agent: Please be advised that RX refills may take up to 3 business days. We ask that you follow-up with your pharmacy.

## 2017-12-15 NOTE — Telephone Encounter (Signed)
Requested Prescriptions  Pending Prescriptions Disp Refills  . allopurinol (ZYLOPRIM) 100 MG tablet 30 tablet 6    Sig: Take 1 tablet (100 mg total) by mouth daily.     Endocrinology:  Gout Agents Failed - 12/15/2017  8:31 AM      Failed - Uric Acid in normal range and within 360 days    Uric Acid  Date Value Ref Range Status  11/18/2017 12.1 (H) 3.7 - 8.6 mg/dL Final    Comment:               Therapeutic target for gout patients: <6.0         Passed - Cr in normal range and within 360 days    Creatinine, Ser  Date Value Ref Range Status  11/18/2017 1.03 0.76 - 1.27 mg/dL Final         Passed - Valid encounter within last 12 months    Recent Outpatient Visits          3 weeks ago Acute gout of left ankle, unspecified cause   Primary Care at [facility] Sandria Bales, MD   1 year ago Acute gout involving toe of left foot, unspecified cause   [facility], Godfrey Pick, FNP   4 years ago Gout   [facility], Tessa Lerner, MD           Pt stated he lost his paper prescription.

## 2017-12-15 NOTE — Progress Notes (Unsigned)
Initial approval was done by print- resending to be able to send electronically

## 2018-01-31 ENCOUNTER — Other Ambulatory Visit: Payer: Self-pay | Admitting: Family Medicine

## 2018-01-31 DIAGNOSIS — M109 Gout, unspecified: Secondary | ICD-10-CM

## 2018-01-31 NOTE — Telephone Encounter (Signed)
Requested medication (s) are due for refill today: no  Requested medication (s) are on the active medication list: yes  Last refill:  11/18/17 #12  Future visit scheduled: No  Notes to clinic:  Unable to refill per protocol. LOV on 11/18/17 with Dr. Alwyn RenHopper    Requested Prescriptions  Pending Prescriptions Disp Refills   predniSONE (DELTASONE) 20 MG tablet [Pharmacy Med Name: PREDNISONE 20MG  TABLETS] 12 tablet 0    Sig: TAKE 3 TABLETS DAILY FOR 2 DAYS, 2 DAILY FOR 2 DAYS, 1 DAILY FOR 2 DAYS FOR GOUT     Not Delegated - Endocrinology:  Oral Corticosteroids Failed - 01/31/2018  6:40 PM      Failed - This refill cannot be delegated      Passed - Last BP in normal range    BP Readings from Last 1 Encounters:  11/18/17 136/80         Passed - Valid encounter within last 6 months    Recent Outpatient Visits          2 months ago Acute gout of left ankle, unspecified cause   [facility] Sandria Balesavid H, MD   1 year ago Acute gout involving toe of left foot, unspecified cause   [facility], Godfrey PickKimberly S, FNP   4 years ago Gout   [facility], Tessa LernerJeffery S, MD

## 2018-07-03 ENCOUNTER — Other Ambulatory Visit: Payer: Self-pay | Admitting: Family Medicine

## 2018-07-03 DIAGNOSIS — M109 Gout, unspecified: Secondary | ICD-10-CM

## 2018-07-03 NOTE — Telephone Encounter (Signed)
Requested medication (s) are due for refill today:   No  Requested medication (s) are on the active medication list:   Yes  Future visit scheduled:   No  LOV was with Dr. Alwyn Ren 11/18/17.   Last ordered: 11/18/17  #30  1 refill Unable lo fill due to LOV 11/18/17.     Requested Prescriptions  Pending Prescriptions Disp Refills   colchicine 0.6 MG tablet [Pharmacy Med Name: COLCHICINE 0.6MG  TABLETS] 30 tablet 1    Sig: TAKE 1 TABLET BY MOUTH TWICE DAILY TODAY, THEN 1 DAILY UNTIL GOUT IS IMPROVED. USE WHENEVER AN ACUTE FLARE     Endocrinology:  Gout Agents Failed - 07/03/2018 11:08 AM      Failed - Uric Acid in normal range and within 360 days    Uric Acid  Date Value Ref Range Status  11/18/2017 12.1 (H) 3.7 - 8.6 mg/dL Final    Comment:               Therapeutic target for gout patients: <6.0         Passed - Cr in normal range and within 360 days    Creatinine, Ser  Date Value Ref Range Status  11/18/2017 1.03 0.76 - 1.27 mg/dL Final         Passed - Valid encounter within last 12 months    Recent Outpatient Visits          7 months ago Acute gout of left ankle, unspecified cause   Primary Care at [facility], Sandria Bales, MD   2 years ago Acute gout involving toe of left foot, unspecified cause   [facility], Godfrey Pick, FNP   5 years ago Gout   [facility], Tessa Lerner, MD

## 2018-12-18 ENCOUNTER — Ambulatory Visit: Payer: BC Managed Care – PPO | Admitting: Family Medicine

## 2018-12-18 ENCOUNTER — Other Ambulatory Visit: Payer: Self-pay

## 2018-12-18 ENCOUNTER — Encounter: Payer: Self-pay | Admitting: Family Medicine

## 2018-12-18 ENCOUNTER — Ambulatory Visit: Payer: BLUE CROSS/BLUE SHIELD | Admitting: Family Medicine

## 2018-12-18 VITALS — BP 135/87 | HR 100 | Temp 98.3°F | Wt 282.2 lb

## 2018-12-18 DIAGNOSIS — M109 Gout, unspecified: Secondary | ICD-10-CM

## 2018-12-18 DIAGNOSIS — E790 Hyperuricemia without signs of inflammatory arthritis and tophaceous disease: Secondary | ICD-10-CM

## 2018-12-18 DIAGNOSIS — M1A09X0 Idiopathic chronic gout, multiple sites, without tophus (tophi): Secondary | ICD-10-CM | POA: Diagnosis not present

## 2018-12-18 MED ORDER — DICLOFENAC SODIUM 75 MG PO TBEC: 75.0000 mg | DELAYED_RELEASE_TABLET | Freq: Two times a day (BID) | ORAL | 2 refills | Status: DC

## 2018-12-18 MED ORDER — COLCHICINE 0.6 MG PO TABS: ORAL_TABLET | ORAL | 2 refills | Status: AC

## 2018-12-18 MED ORDER — ALLOPURINOL 300 MG PO TABS: 300.0000 mg | ORAL_TABLET | Freq: Every day | ORAL | 3 refills | Status: DC

## 2018-12-18 NOTE — Progress Notes (Signed)
Patient ID: Aaron Freeman, male    DOB: 09-13-1980  Age: 38 y.o. MRN: [id number]  Chief Complaint  Patient presents with  . Gout     gout in the left toe. Out of gout meds and would like a high dose on gout med. Still have some tenderness right now. Pain started on 12/13/2018    Subjective:   Patient has a long history of gout.  The colchicine usually can keep it under fairly good control.  Recently he has had a lot of problems in his left great toe and could not get any medication before having to make a trip to [place].  He works at [employer] and is on his feet.  Day 1 him to be standing at his job all the time but he cannot do to his knee pains.  The gout has bothered primarily his knees and his feet.  Current allergies, medications, problem list, past/family and social histories reviewed.  Objective:  BP 135/87 (BP Location: Right Arm, Patient Position: Sitting, Cuff Size: Large)   Pulse 100   Temp 98.3 F (36.8 C) (Oral)   Wt 282 lb 3.2 oz (128 kg)   SpO2 97%   BMI 37.23 kg/m   Overweight gentleman, no major distress.  Abdomen soft without hepatosplenomegaly.  Left great toe is not inflamed but very tender in the MCP joint.  Assessment & Plan:   Assessment: 1. Acute gout of left ankle, unspecified cause   2. Chronic gout of multiple sites, unspecified cause   3. Hyperuricemia       Plan: See instructions.  Discussed in detail with patient.  Orders Placed This Encounter  Procedures  . Comprehensive metabolic panel  . Uric acid    Meds ordered this encounter  Medications  . allopurinol (ZYLOPRIM) 300 MG tablet    Sig: Take 1 tablet (300 mg total) by mouth daily.    Dispense:  90 tablet    Refill:  3  . colchicine 0.6 MG tablet    Sig: TAKE 1 TABLET BY MOUTH TWICE DAILY TODAY, THEN 1 DAILY UNTIL GOUT IS IMPROVED. USE WHENEVER AN ACUTE FLARE    Dispense:  30 tablet    Refill:  2  . diclofenac (VOLTAREN) 75 MG EC tablet    Sig: Take 1 tablet (75 mg total) by mouth 2  (two) times daily.    Dispense:  30 tablet    Refill:  2         Patient Instructions     We are checking labs on you today and will let you know the results of them when available.  If you do not hear from them over the next week or 10 days call back.  Increase the allopurinol to 300 mg 1 daily  Use the colchicine as directed when needed for flares of the gout.  Use the diclofenac 75 mg 1 twice daily with food if needed for arthritic gouty pain.  You can take Tylenol (acetaminophen) 500 mg 2 pills 3 times daily (maximum 3000 mg in 24 hours to avoid harming your liver) if needed for pain  You can also use ibuprofen 200 mg 3 or 4 pills 3 times daily if needed for pain (do not exceed 2400 mg in 24 hours.)  This should not be taken when you are on the diclofenac.  When taking either the diclofenac or the ibuprofen discontinue if you have abdominal pain or any indication of gastrointestinal bleeding such as vomiting blood or passing  blood in your stool.  Return in about 4 months for a recheck of your labs.  Come sooner if problems.  If you have lab work done today you will be contacted with your lab results within the next 2 weeks.  If you have not heard from [place] then please contact us. The fastest way to get your results is to register for My Chart.   IF you received an x-ray today, you will receive an invoice from [facility]. Please contact [facility] at [phone] with questions or concerns regarding your invoice.   IF you received labwork today, you will receive an invoice from [facility]. Please contact [facility] at [phone] with questions or concerns regarding your invoice.   Our billing staff will not be able to assist you with questions regarding bills from these companies.  You will be contacted with the lab results as soon as they are available. The fastest way to get your results is to activate your My Chart account. Instructions are located on the  last page of this paperwork. If you have not heard from [place] regarding the results in 2 weeks, please contact this office.         Return in about 4 months (around 04/20/2019) for Follow-up on gout and labs.   Ruben Reason, MD 12/18/2018

## 2018-12-18 NOTE — Patient Instructions (Addendum)
   We are checking labs on you today and will let you know the results of them when available.  If you do not hear from them over the next week or 10 days call back.  Increase the allopurinol to 300 mg 1 daily  Use the colchicine as directed when needed for flares of the gout.  Use the diclofenac 75 mg 1 twice daily with food if needed for arthritic gouty pain.  You can take Tylenol (acetaminophen) 500 mg 2 pills 3 times daily (maximum 3000 mg in 24 hours to avoid harming your liver) if needed for pain  You can also use ibuprofen 200 mg 3 or 4 pills 3 times daily if needed for pain (do not exceed 2400 mg in 24 hours.)  This should not be taken when you are on the diclofenac.  When taking either the diclofenac or the ibuprofen discontinue if you have abdominal pain or any indication of gastrointestinal bleeding such as vomiting blood or passing blood in your stool.  Return in about 4 months for a recheck of your labs.  Come sooner if problems.  If you have lab work done today you will be contacted with your lab results within the next 2 weeks.  If you have not heard from [place] then please contact us. The fastest way to get your results is to register for My Chart.   IF you received an x-ray today, you will receive an invoice from [facility]. Please contact [facility] at [phone] with questions or concerns regarding your invoice.   IF you received labwork today, you will receive an invoice from [employer]. Please contact [facility] at [phone] with questions or concerns regarding your invoice.   Our billing staff will not be able to assist you with questions regarding bills from these companies.  You will be contacted with the lab results as soon as they are available. The fastest way to get your results is to activate your My Chart account. Instructions are located on the last page of this paperwork. If you have not heard from [place] regarding the results in 2 weeks, please  contact this office.

## 2018-12-19 LAB — COMPREHENSIVE METABOLIC PANEL
ALT: 108 IU/L — ABNORMAL HIGH (ref 0–44)
AST: 48 IU/L — ABNORMAL HIGH (ref 0–40)
Albumin/Globulin Ratio: 1.7 (ref 1.2–2.2)
Albumin: 4.9 g/dL (ref 4.0–5.0)
Alkaline Phosphatase: 58 IU/L (ref 39–117)
BUN/Creatinine Ratio: 13 (ref 9–20)
BUN: 13 mg/dL (ref 6–20)
Bilirubin Total: 0.4 mg/dL (ref 0.0–1.2)
CO2: 22 mmol/L (ref 20–29)
Calcium: 9.9 mg/dL (ref 8.7–10.2)
Chloride: 100 mmol/L (ref 96–106)
Creatinine, Ser: 1.01 mg/dL (ref 0.76–1.27)
GFR calc Af Amer: 109 mL/min/{1.73_m2} (ref >59)
GFR calc non Af Amer: 94 mL/min/{1.73_m2} (ref >59)
Globulin, Total: 2.9 g/dL (ref 1.5–4.5)
Glucose: 105 mg/dL — ABNORMAL HIGH (ref 65–99)
Potassium: 4.1 mmol/L (ref 3.5–5.2)
Sodium: 141 mmol/L (ref 134–144)
Total Protein: 7.8 g/dL (ref 6.0–8.5)

## 2018-12-19 LAB — URIC ACID: Uric Acid: 6.4 mg/dL (ref 3.7–8.6)

## 2018-12-25 ENCOUNTER — Encounter: Payer: Self-pay | Admitting: Radiology

## 2019-04-18 ENCOUNTER — Other Ambulatory Visit: Payer: Self-pay

## 2019-04-18 DIAGNOSIS — M1A09X0 Idiopathic chronic gout, multiple sites, without tophus (tophi): Secondary | ICD-10-CM

## 2019-04-18 DIAGNOSIS — E790 Hyperuricemia without signs of inflammatory arthritis and tophaceous disease: Secondary | ICD-10-CM

## 2019-04-18 DIAGNOSIS — M109 Gout, unspecified: Secondary | ICD-10-CM

## 2019-04-18 MED ORDER — DICLOFENAC SODIUM 75 MG PO TBEC: 75.0000 mg | DELAYED_RELEASE_TABLET | Freq: Two times a day (BID) | ORAL | 0 refills | Status: AC

## 2019-07-04 ENCOUNTER — Telehealth: Payer: Self-pay | Admitting: General Practice

## 2019-07-04 NOTE — Telephone Encounter (Signed)
Called pt to let him know that he would need a toc for him to get proscriptions filled. Pt stated he will cb to sch that toc appt at another time.

## 2019-07-08 ENCOUNTER — Other Ambulatory Visit: Payer: Self-pay

## 2019-07-08 DIAGNOSIS — E790 Hyperuricemia without signs of inflammatory arthritis and tophaceous disease: Secondary | ICD-10-CM

## 2019-07-08 DIAGNOSIS — M1A09X0 Idiopathic chronic gout, multiple sites, without tophus (tophi): Secondary | ICD-10-CM

## 2019-07-08 DIAGNOSIS — M109 Gout, unspecified: Secondary | ICD-10-CM

## 2019-12-30 ENCOUNTER — Other Ambulatory Visit: Payer: Self-pay | Admitting: Family Medicine

## 2019-12-30 DIAGNOSIS — M1A09X0 Idiopathic chronic gout, multiple sites, without tophus (tophi): Secondary | ICD-10-CM

## 2019-12-30 DIAGNOSIS — M109 Gout, unspecified: Secondary | ICD-10-CM

## 2019-12-30 DIAGNOSIS — E790 Hyperuricemia without signs of inflammatory arthritis and tophaceous disease: Secondary | ICD-10-CM

## 2019-12-30 NOTE — Telephone Encounter (Signed)
No further refills without office visit 

## 2019-12-30 NOTE — Telephone Encounter (Signed)
Requested medication (s) are due for refill today: no  Requested medication (s) are on the active medication list: yes   Last refill: 11/24/2019  Future visit scheduled: no  Notes to clinic:  overdue for follow up and labs    Requested Prescriptions  Pending Prescriptions Disp Refills   allopurinol (ZYLOPRIM) 300 MG tablet [Pharmacy Med Name: ALLOPURINOL 300MG  TABLETS] 90 tablet 3    Sig: TAKE 1 TABLET(300 MG) BY MOUTH DAILY      Endocrinology:  Gout Agents Failed - 12/30/2019  3:10 AM      Failed - Uric Acid in normal range and within 360 days    Uric Acid  Date Value Ref Range Status  12/18/2018 6.4 3.7 - 8.6 mg/dL Final    Comment:               Therapeutic target for gout patients: <6.0          Failed - Cr in normal range and within 360 days    Creatinine, Ser  Date Value Ref Range Status  12/18/2018 1.01 0.76 - 1.27 mg/dL Final          Failed - Valid encounter within last 12 months    Recent Outpatient Visits           1 year ago Acute gout of left ankle, unspecified cause   Primary Care at [facility], [facility], MD   2 years ago Acute gout of left ankle, unspecified cause   Primary Care at [facility], [facility], MD   3 years ago Acute gout involving toe of left foot, unspecified cause   [facility] at [facility], [facility], FNP   6 years ago Gout   [facility] at [facility], [facility], MD

## 2020-03-04 ENCOUNTER — Other Ambulatory Visit: Payer: Self-pay | Admitting: Family Medicine

## 2020-03-04 DIAGNOSIS — M109 Gout, unspecified: Secondary | ICD-10-CM

## 2020-03-04 DIAGNOSIS — E790 Hyperuricemia without signs of inflammatory arthritis and tophaceous disease: Secondary | ICD-10-CM

## 2020-03-04 DIAGNOSIS — M1A09X0 Idiopathic chronic gout, multiple sites, without tophus (tophi): Secondary | ICD-10-CM

## 2022-05-13 ENCOUNTER — Encounter (HOSPITAL_BASED_OUTPATIENT_CLINIC_OR_DEPARTMENT_OTHER): Payer: Self-pay

## 2022-05-13 ENCOUNTER — Other Ambulatory Visit: Payer: Self-pay

## 2022-05-13 ENCOUNTER — Emergency Department (HOSPITAL_BASED_OUTPATIENT_CLINIC_OR_DEPARTMENT_OTHER): Payer: BC Managed Care – PPO

## 2022-05-13 ENCOUNTER — Emergency Department (HOSPITAL_BASED_OUTPATIENT_CLINIC_OR_DEPARTMENT_OTHER)
Admission: EM | Admit: 2022-05-13 | Discharge: 2022-05-13 | Disposition: A | Discharge status: Home / Self Care | Payer: BC Managed Care – PPO | Attending: Emergency Medicine | Admitting: Emergency Medicine

## 2022-05-13 DIAGNOSIS — N132 Hydronephrosis with renal and ureteral calculous obstruction: Secondary | ICD-10-CM | POA: Insufficient documentation

## 2022-05-13 DIAGNOSIS — M545 Low back pain, unspecified: Secondary | ICD-10-CM | POA: Diagnosis present

## 2022-05-13 DIAGNOSIS — R109 Unspecified abdominal pain: Secondary | ICD-10-CM

## 2022-05-13 DIAGNOSIS — N201 Calculus of ureter: Secondary | ICD-10-CM

## 2022-05-13 LAB — BASIC METABOLIC PANEL
Anion gap: 9 (ref 5–15)
BUN: 14 mg/dL (ref 6–20)
CO2: 28 mmol/L (ref 22–32)
Calcium: 9.8 mg/dL (ref 8.9–10.3)
Chloride: 103 mmol/L (ref 98–111)
Creatinine, Ser: 1.4 mg/dL — ABNORMAL HIGH (ref 0.61–1.24)
GFR, Estimated: >60 mL/min (ref >60)
Glucose, Bld: 141 mg/dL — ABNORMAL HIGH (ref 70–99)
Potassium: 4.1 mmol/L (ref 3.5–5.1)
Sodium: 140 mmol/L (ref 135–145)

## 2022-05-13 LAB — CBC
HCT: 47.2 % (ref 39.0–52.0)
Hemoglobin: 16.3 g/dL (ref 13.0–17.0)
MCH: 30.9 pg (ref 26.0–34.0)
MCHC: 34.5 g/dL (ref 30.0–36.0)
MCV: 89.6 fL (ref 80.0–100.0)
Platelets: 255 10*3/uL (ref 150–400)
RBC: 5.27 MIL/uL (ref 4.22–5.81)
RDW: 12.8 % (ref 11.5–15.5)
WBC: 13.6 10*3/uL — ABNORMAL HIGH (ref 4.0–10.5)
nRBC: 0 % (ref 0.0–0.2)

## 2022-05-13 LAB — URINALYSIS, ROUTINE W REFLEX MICROSCOPIC
Bilirubin Urine: NEGATIVE
Glucose, UA: NEGATIVE mg/dL
Hgb urine dipstick: NEGATIVE
Ketones, ur: NEGATIVE mg/dL
Leukocytes,Ua: NEGATIVE
Nitrite: NEGATIVE
Protein, ur: NEGATIVE mg/dL
Specific Gravity, Urine: 1.018 (ref 1.005–1.030)
pH: 6.5 (ref 5.0–8.0)

## 2022-05-13 MED ORDER — HYDROCODONE-ACETAMINOPHEN 5-325 MG PO TABS: 1.0000 | ORAL_TABLET | Freq: Four times a day (QID) | ORAL | 0 refills | Status: AC | PRN

## 2022-05-13 MED ORDER — HYDROMORPHONE HCL 1 MG/ML IJ SOLN
1.0000 mg | Freq: Once | INTRAMUSCULAR | Status: AC
  Administered 2022-05-13: 1 mg via INTRAVENOUS
  Filled 2022-05-13: qty 1

## 2022-05-13 MED ORDER — ONDANSETRON 4 MG PO TBDP: 4.0000 mg | ORAL_TABLET | Freq: Three times a day (TID) | ORAL | 1 refills | Status: AC | PRN

## 2022-05-13 MED ORDER — ONDANSETRON HCL 4 MG/2ML IJ SOLN
4.0000 mg | Freq: Once | INTRAMUSCULAR | Status: AC
  Administered 2022-05-13: 4 mg via INTRAVENOUS
  Filled 2022-05-13: qty 2

## 2022-05-13 MED ORDER — SODIUM CHLORIDE 0.9 % IV SOLN: INTRAVENOUS | Status: DC

## 2022-05-13 NOTE — ED Triage Notes (Signed)
Patient here POV from Home.  Endorses waking up after nap today at 1500 today with Right Lower Back Pain. Some nausea. No emesis. No Diarrhea. No Fevers. No Dysuria.   Uncomfortable during Triage. A&Ox4. Gcs 15. Ambulatory.

## 2022-05-13 NOTE — Discharge Instructions (Signed)
CT consistent with a 2 mm right ureteral kidney stone.  Call urology for follow-up.  Recommend taking 800 mg of Motrin every 8 hours.  Take the Zofran as needed for nausea vomiting.  Take the hydrocodone as needed for more pain relief.  Expect you to pass the stone in the next 24 hours if not sooner.  Return for fevers return for persistent vomiting.

## 2022-05-13 NOTE — ED Provider Notes (Addendum)
[facility] Provider Note   CSN: [id number] Arrival date & time: 05/13/22  [id number]     History  Chief Complaint  Patient presents with   Back Pain    Aaron Freeman is a 42 y.o. male.  Patient was on a field trip earlier today lay down to take a nap.  Awoke at about 1500 with acute severe right lower back pain, right CVA.  Associated with some nausea no vomiting.  No diarrhea.  No fevers.  Patient has not had pain like this before.  No history of any back problems.  However he was told a long time ago that he had an incidental kidney stone.  But never had any symptoms.  Patient denies any radiation of pain down the right leg.  Patient denies any numbness or weakness to the right leg or foot.  Past medical history noncontributory.  Patient uses smokeless tobacco.  Pain is not made worse by movement of his legs.       Home Medications Prior to Admission medications   Medication Sig Start Date End Date Taking? Authorizing Provider  allopurinol (ZYLOPRIM) 300 MG tablet TAKE 1 TABLET(300 MG) BY MOUTH DAILY 03/04/20   Just, Laurita Quint, FNP  colchicine 0.6 MG tablet TAKE 1 TABLET BY MOUTH TWICE DAILY TODAY, THEN 1 DAILY UNTIL GOUT IS IMPROVED. USE WHENEVER AN ACUTE FLARE 12/18/18   Posey Boyer, MD  diclofenac (VOLTAREN) 75 MG EC tablet Take 1 tablet (75 mg total) by mouth 2 (two) times daily. 04/18/19   Posey Boyer, MD      Allergies    Patient has no known allergies.    Review of Systems   Review of Systems  Constitutional:  Negative for chills and fever.  HENT:  Negative for ear pain and sore throat.   Eyes:  Negative for pain and visual disturbance.  Respiratory:  Negative for cough and shortness of breath.   Cardiovascular:  Negative for chest pain and palpitations.  Gastrointestinal:  Positive for nausea. Negative for abdominal pain and vomiting.  Genitourinary:  Positive for flank pain. Negative for dysuria and hematuria.   Musculoskeletal:  Positive for back pain. Negative for arthralgias.  Skin:  Negative for color change and rash.  Neurological:  Negative for seizures and syncope.  All other systems reviewed and are negative.   Physical Exam Updated Vital Signs BP (!) 156/93 (BP Location: Right Arm)   Pulse 62   Temp 98.5 F (36.9 C) (Oral)   Resp 18   Ht 1.854 m ('6\' 1"'$ )   Wt 128 kg   SpO2 100%   BMI 37.23 kg/m  Physical Exam Vitals and nursing note reviewed.  Constitutional:      General: He is not in acute distress.    Appearance: Normal appearance. He is well-developed.  HENT:     Head: Normocephalic and atraumatic.  Eyes:     Extraocular Movements: Extraocular movements intact.     Conjunctiva/sclera: Conjunctivae normal.     Pupils: Pupils are equal, round, and reactive to light.  Cardiovascular:     Rate and Rhythm: Normal rate and regular rhythm.     Heart sounds: No murmur heard. Pulmonary:     Effort: Pulmonary effort is normal. No respiratory distress.     Breath sounds: Normal breath sounds.  Abdominal:     Palpations: Abdomen is soft.     Tenderness: There is no abdominal tenderness.  Musculoskeletal:  General: No swelling.     Cervical back: Normal range of motion and neck supple.  Skin:    General: Skin is warm and dry.     Capillary Refill: Capillary refill takes less than 2 seconds.  Neurological:     General: No focal deficit present.     Mental Status: He is alert and oriented to person, place, and time.     Cranial Nerves: No cranial nerve deficit.     Sensory: No sensory deficit.     Motor: No weakness.  Psychiatric:        Mood and Affect: Mood normal.     ED Results / Procedures / Treatments   Labs (all labs ordered are listed, but only abnormal results are displayed) Labs Reviewed  CBC - Abnormal; Notable for the following components:      Result Value   WBC 13.6 (*)    All other components within normal limits  BASIC METABOLIC PANEL -  Abnormal; Notable for the following components:   Glucose, Bld 141 (*)    Creatinine, Ser 1.40 (*)    All other components within normal limits  URINALYSIS, ROUTINE W REFLEX MICROSCOPIC    EKG None  Radiology CT Renal Stone Study  Result Date: 05/13/2022 CLINICAL DATA:  Abdominal/flank pain. Stones suspected on the right. EXAM: CT ABDOMEN AND PELVIS WITHOUT CONTRAST TECHNIQUE: Multidetector CT imaging of the abdomen and pelvis was performed following the standard protocol without IV contrast. RADIATION DOSE REDUCTION: This exam was performed according to the departmental dose-optimization program which includes automated exposure control, adjustment of the mA and/or kV according to patient size and/or use of iterative reconstruction technique. COMPARISON:  CT abdomen and pelvis 10/09/2015 FINDINGS: Lower chest: No acute abnormality. 7 mm posterior left lower lobe pulmonary nodule (4/10) is new since 2017. Additional 5 mm left pulmonary nodule (4/14) is unchanged from 2017 and compatible with benign etiology. Hepatobiliary: Unremarkable noncontrast appearance of the liver. Small stone in the neck of the gallbladder. No evidence of cholecystitis. Unremarkable biliary tree. Pancreas: Unremarkable. Spleen: Unremarkable. Adrenals/Urinary Tract: Normal adrenal glands. Punctate 2 mm stone at the right UVJ with mild right hydroureteronephrosis. Periureteral and perinephric stranding about the right kidney and ureter. Additional punctate 1-2 mm nonobstructing calyceal stone in the upper pole of the right kidney. No left renal calculi or hydronephrosis. Unremarkable bladder. Stomach/Bowel: Normal caliber large and small bowel. Colonic diverticulosis without diverticulitis. Normal appendix. No bowel wall thickening. Vascular/Lymphatic: No significant vascular findings are present. No enlarged abdominal or pelvic lymph nodes. Reproductive: Uterus and bilateral adnexa are unremarkable. Other: No free intraperitoneal  air. Small fat containing left inguinal hernia. Musculoskeletal: No acute osseous abnormality. IMPRESSION: 1. Punctate 2 mm stone at the right UVJ with mild right hydroureteronephrosis. 2. 7 mm left lower lobe pulmonary nodule, new since 2017. Non-contrast chest CT at 6-12 months is recommended. If the nodule is stable at time of repeat CT, then future CT at 18-24 months (from today's scan) is considered optional for low-risk patients, but is recommended for high-risk patients. This recommendation follows the consensus statement: Guidelines for Management of Incidental Pulmonary Nodules Detected on CT Images: From the Fleischner Society 2017; Radiology 2017; 284:228-243. Electronically Signed   By: Placido Sou M.D.   On: 05/13/2022 20:39    Procedures Procedures    Medications Ordered in ED Medications  0.9 %  sodium chloride infusion ( Intravenous New Bag/Given 05/13/22 1957)  ondansetron (ZOFRAN) injection 4 mg (4 mg Intravenous Given 05/13/22 1954)  HYDROmorphone (DILAUDID) injection 1 mg (1 mg Intravenous Given 05/13/22 1954)  HYDROmorphone (DILAUDID) injection 1 mg (1 mg Intravenous Given 05/13/22 2046)    ED Course/ Medical Decision Making/ A&P                             Medical Decision Making Amount and/or Complexity of Data Reviewed Labs: ordered. Radiology: ordered.  Risk Prescription drug management.   Symptoms could be musculoskeletal.  But need to rule out kidney stone prickly with the acute onset patient feeling fine before.  And no evidence of any neurological deficits.  Clinically suspect right-sided kidney stone.  CBC white count 13.6 hemoglobin normal basic metabolic panel normal creatinine up a little bit at 1.4.  But renal functions normal electrolytes normal.  Urinalysis negative  CT scan shows 2 mm right ureteral stone almost in the bladder.  This is reassuring.  Patient's pain improved.  Will have him follow-up with urology.  Will treat symptomatically.   Final  Clinical Impression(s) / ED Diagnoses Final diagnoses:  Acute right flank pain  Right ureteral stone    Rx / DC Orders ED Discharge Orders     None         Fredia Sorrow, MD 05/13/22 1947    Fredia Sorrow, MD 05/13/22 2115
